# Patient Record
Sex: Male | Born: 1950 | Race: White | Hispanic: No | Marital: Married | Smoking: Never smoker
Health system: Southern US, Community
[De-identification: ages and names within clinical notes are randomized; demographics above are authoritative.]

## PROBLEM LIST (undated history)

## (undated) DIAGNOSIS — E119 Type 2 diabetes mellitus without complications: Secondary | ICD-10-CM

## (undated) DIAGNOSIS — I10 Essential (primary) hypertension: Secondary | ICD-10-CM

---

## 2018-11-10 ENCOUNTER — Other Ambulatory Visit: Payer: Self-pay

## 2018-11-10 ENCOUNTER — Encounter (INDEPENDENT_AMBULATORY_CARE_PROVIDER_SITE_OTHER): Payer: Medicare Other | Admitting: Ophthalmology

## 2018-11-10 DIAGNOSIS — H43813 Vitreous degeneration, bilateral: Secondary | ICD-10-CM

## 2018-11-10 DIAGNOSIS — H35033 Hypertensive retinopathy, bilateral: Secondary | ICD-10-CM | POA: Diagnosis not present

## 2018-11-10 DIAGNOSIS — H33022 Retinal detachment with multiple breaks, left eye: Secondary | ICD-10-CM | POA: Diagnosis not present

## 2018-11-10 DIAGNOSIS — H353132 Nonexudative age-related macular degeneration, bilateral, intermediate dry stage: Secondary | ICD-10-CM | POA: Diagnosis not present

## 2018-11-10 DIAGNOSIS — H2513 Age-related nuclear cataract, bilateral: Secondary | ICD-10-CM

## 2018-11-10 DIAGNOSIS — I10 Essential (primary) hypertension: Secondary | ICD-10-CM

## 2018-11-10 DIAGNOSIS — H4312 Vitreous hemorrhage, left eye: Secondary | ICD-10-CM

## 2019-01-11 ENCOUNTER — Encounter (INDEPENDENT_AMBULATORY_CARE_PROVIDER_SITE_OTHER): Payer: Medicare Other | Admitting: Ophthalmology

## 2019-01-13 ENCOUNTER — Encounter (INDEPENDENT_AMBULATORY_CARE_PROVIDER_SITE_OTHER): Payer: Medicare Other | Admitting: Ophthalmology

## 2019-01-13 ENCOUNTER — Other Ambulatory Visit: Payer: Self-pay

## 2019-01-13 DIAGNOSIS — E103592 Type 1 diabetes mellitus with proliferative diabetic retinopathy without macular edema, left eye: Secondary | ICD-10-CM | POA: Diagnosis not present

## 2019-01-13 DIAGNOSIS — E10319 Type 1 diabetes mellitus with unspecified diabetic retinopathy without macular edema: Secondary | ICD-10-CM

## 2019-01-13 DIAGNOSIS — H35033 Hypertensive retinopathy, bilateral: Secondary | ICD-10-CM

## 2019-01-13 DIAGNOSIS — I10 Essential (primary) hypertension: Secondary | ICD-10-CM

## 2019-01-13 DIAGNOSIS — E103291 Type 1 diabetes mellitus with mild nonproliferative diabetic retinopathy without macular edema, right eye: Secondary | ICD-10-CM | POA: Diagnosis not present

## 2019-01-13 DIAGNOSIS — H4312 Vitreous hemorrhage, left eye: Secondary | ICD-10-CM

## 2019-01-13 DIAGNOSIS — H43813 Vitreous degeneration, bilateral: Secondary | ICD-10-CM

## 2019-01-13 DIAGNOSIS — H33302 Unspecified retinal break, left eye: Secondary | ICD-10-CM

## 2019-01-13 DIAGNOSIS — H2513 Age-related nuclear cataract, bilateral: Secondary | ICD-10-CM

## 2019-01-13 DIAGNOSIS — H353111 Nonexudative age-related macular degeneration, right eye, early dry stage: Secondary | ICD-10-CM

## 2019-03-12 ENCOUNTER — Encounter (INDEPENDENT_AMBULATORY_CARE_PROVIDER_SITE_OTHER): Payer: Medicare Other | Admitting: Ophthalmology

## 2019-07-16 ENCOUNTER — Encounter (INDEPENDENT_AMBULATORY_CARE_PROVIDER_SITE_OTHER): Payer: Medicare Other | Admitting: Ophthalmology

## 2019-07-16 DIAGNOSIS — E103592 Type 1 diabetes mellitus with proliferative diabetic retinopathy without macular edema, left eye: Secondary | ICD-10-CM | POA: Diagnosis not present

## 2019-07-16 DIAGNOSIS — H43813 Vitreous degeneration, bilateral: Secondary | ICD-10-CM

## 2019-07-16 DIAGNOSIS — H35033 Hypertensive retinopathy, bilateral: Secondary | ICD-10-CM | POA: Diagnosis not present

## 2019-07-16 DIAGNOSIS — E10319 Type 1 diabetes mellitus with unspecified diabetic retinopathy without macular edema: Secondary | ICD-10-CM

## 2019-07-16 DIAGNOSIS — I10 Essential (primary) hypertension: Secondary | ICD-10-CM | POA: Diagnosis not present

## 2019-07-16 DIAGNOSIS — H353132 Nonexudative age-related macular degeneration, bilateral, intermediate dry stage: Secondary | ICD-10-CM

## 2020-01-14 ENCOUNTER — Other Ambulatory Visit: Payer: Self-pay

## 2020-01-14 ENCOUNTER — Encounter (INDEPENDENT_AMBULATORY_CARE_PROVIDER_SITE_OTHER): Payer: Medicare Other | Admitting: Ophthalmology

## 2020-01-14 DIAGNOSIS — I10 Essential (primary) hypertension: Secondary | ICD-10-CM

## 2020-01-14 DIAGNOSIS — E103592 Type 1 diabetes mellitus with proliferative diabetic retinopathy without macular edema, left eye: Secondary | ICD-10-CM | POA: Diagnosis not present

## 2020-01-14 DIAGNOSIS — H33302 Unspecified retinal break, left eye: Secondary | ICD-10-CM

## 2020-01-14 DIAGNOSIS — E103291 Type 1 diabetes mellitus with mild nonproliferative diabetic retinopathy without macular edema, right eye: Secondary | ICD-10-CM

## 2020-01-14 DIAGNOSIS — H35033 Hypertensive retinopathy, bilateral: Secondary | ICD-10-CM

## 2020-01-14 DIAGNOSIS — H43813 Vitreous degeneration, bilateral: Secondary | ICD-10-CM

## 2020-01-14 DIAGNOSIS — E10319 Type 1 diabetes mellitus with unspecified diabetic retinopathy without macular edema: Secondary | ICD-10-CM | POA: Diagnosis not present

## 2020-02-06 ENCOUNTER — Emergency Department (HOSPITAL_BASED_OUTPATIENT_CLINIC_OR_DEPARTMENT_OTHER): Payer: Medicare Other

## 2020-02-06 ENCOUNTER — Other Ambulatory Visit: Payer: Self-pay

## 2020-02-06 ENCOUNTER — Encounter (HOSPITAL_BASED_OUTPATIENT_CLINIC_OR_DEPARTMENT_OTHER): Payer: Self-pay

## 2020-02-06 ENCOUNTER — Emergency Department (HOSPITAL_BASED_OUTPATIENT_CLINIC_OR_DEPARTMENT_OTHER)
Admission: EM | Admit: 2020-02-06 | Discharge: 2020-02-06 | Disposition: A | Discharge status: Home / Self Care | Payer: Medicare Other | Attending: Emergency Medicine | Admitting: Emergency Medicine

## 2020-02-06 DIAGNOSIS — S0990XA Unspecified injury of head, initial encounter: Secondary | ICD-10-CM | POA: Diagnosis present

## 2020-02-06 DIAGNOSIS — Z79899 Other long term (current) drug therapy: Secondary | ICD-10-CM | POA: Insufficient documentation

## 2020-02-06 DIAGNOSIS — M25551 Pain in right hip: Secondary | ICD-10-CM | POA: Diagnosis not present

## 2020-02-06 DIAGNOSIS — S01411A Laceration without foreign body of right cheek and temporomandibular area, initial encounter: Secondary | ICD-10-CM | POA: Diagnosis not present

## 2020-02-06 DIAGNOSIS — Y998 Other external cause status: Secondary | ICD-10-CM | POA: Diagnosis not present

## 2020-02-06 DIAGNOSIS — E119 Type 2 diabetes mellitus without complications: Secondary | ICD-10-CM | POA: Insufficient documentation

## 2020-02-06 DIAGNOSIS — W19XXXA Unspecified fall, initial encounter: Secondary | ICD-10-CM | POA: Diagnosis not present

## 2020-02-06 DIAGNOSIS — I10 Essential (primary) hypertension: Secondary | ICD-10-CM | POA: Diagnosis not present

## 2020-02-06 DIAGNOSIS — Y92002 Bathroom of unspecified non-institutional (private) residence single-family (private) house as the place of occurrence of the external cause: Secondary | ICD-10-CM | POA: Diagnosis not present

## 2020-02-06 DIAGNOSIS — Y9352 Activity, horseback riding: Secondary | ICD-10-CM | POA: Diagnosis not present

## 2020-02-06 DIAGNOSIS — Z888 Allergy status to other drugs, medicaments and biological substances status: Secondary | ICD-10-CM | POA: Diagnosis not present

## 2020-02-06 DIAGNOSIS — S7291XA Unspecified fracture of right femur, initial encounter for closed fracture: Secondary | ICD-10-CM | POA: Insufficient documentation

## 2020-02-06 DIAGNOSIS — S0083XA Contusion of other part of head, initial encounter: Secondary | ICD-10-CM

## 2020-02-06 HISTORY — DX: Essential (primary) hypertension: I10

## 2020-02-06 HISTORY — DX: Type 2 diabetes mellitus without complications: E11.9

## 2020-02-06 NOTE — ED Notes (Signed)
[address] Elita Quick @ [phone] spoke to [facility]
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
## 2020-02-06 NOTE — ED Notes (Signed)
Patient transported to CT and XR 

## 2020-02-06 NOTE — ED Notes (Signed)
ED Provider at bedside. 

## 2020-02-06 NOTE — Discharge Instructions (Signed)
You were seen in the emergency department today after fall yesterday.  Your CT scans of the head and face were normal.  The laceration is too old to close with stitches.  Please keep the area clean and dry.  The Steri-Strips will come off on their own.  If you develop redness or drainage in the wound please follow closely with your PCP or return to the emergency department for antibiotics  I discussed the case with Dr. Turner Daniels.  We are giving you crutches and have advised that you cannot put more than 20 pounds of weight on the right leg.  You can use the crutches to help keep weight off.  He can see you in the office Tuesday morning at 8:30 AM.

## 2020-07-17 ENCOUNTER — Other Ambulatory Visit: Payer: Self-pay

## 2020-07-17 ENCOUNTER — Encounter (INDEPENDENT_AMBULATORY_CARE_PROVIDER_SITE_OTHER): Payer: Medicare Other | Admitting: Ophthalmology

## 2020-07-17 DIAGNOSIS — I10 Essential (primary) hypertension: Secondary | ICD-10-CM

## 2020-07-17 DIAGNOSIS — E11319 Type 2 diabetes mellitus with unspecified diabetic retinopathy without macular edema: Secondary | ICD-10-CM | POA: Diagnosis not present

## 2020-07-17 DIAGNOSIS — H43813 Vitreous degeneration, bilateral: Secondary | ICD-10-CM

## 2020-07-17 DIAGNOSIS — E113592 Type 2 diabetes mellitus with proliferative diabetic retinopathy without macular edema, left eye: Secondary | ICD-10-CM | POA: Diagnosis not present

## 2020-07-17 DIAGNOSIS — H33302 Unspecified retinal break, left eye: Secondary | ICD-10-CM

## 2020-07-17 DIAGNOSIS — E113291 Type 2 diabetes mellitus with mild nonproliferative diabetic retinopathy without macular edema, right eye: Secondary | ICD-10-CM | POA: Diagnosis not present

## 2020-07-17 DIAGNOSIS — H35033 Hypertensive retinopathy, bilateral: Secondary | ICD-10-CM

## 2020-07-17 DIAGNOSIS — H35372 Puckering of macula, left eye: Secondary | ICD-10-CM
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
## 2020-10-20 IMAGING — CR DG HIP (WITH OR WITHOUT PELVIS) 2-3V*R*
3 series · 3 of 3 positions shown · non-contrast
Comparison: None

CLINICAL DATA: Hip pain following fall

EXAM:
DG HIP (WITH OR WITHOUT PELVIS) 2-3V RIGHT

[t pelvis a.p.]
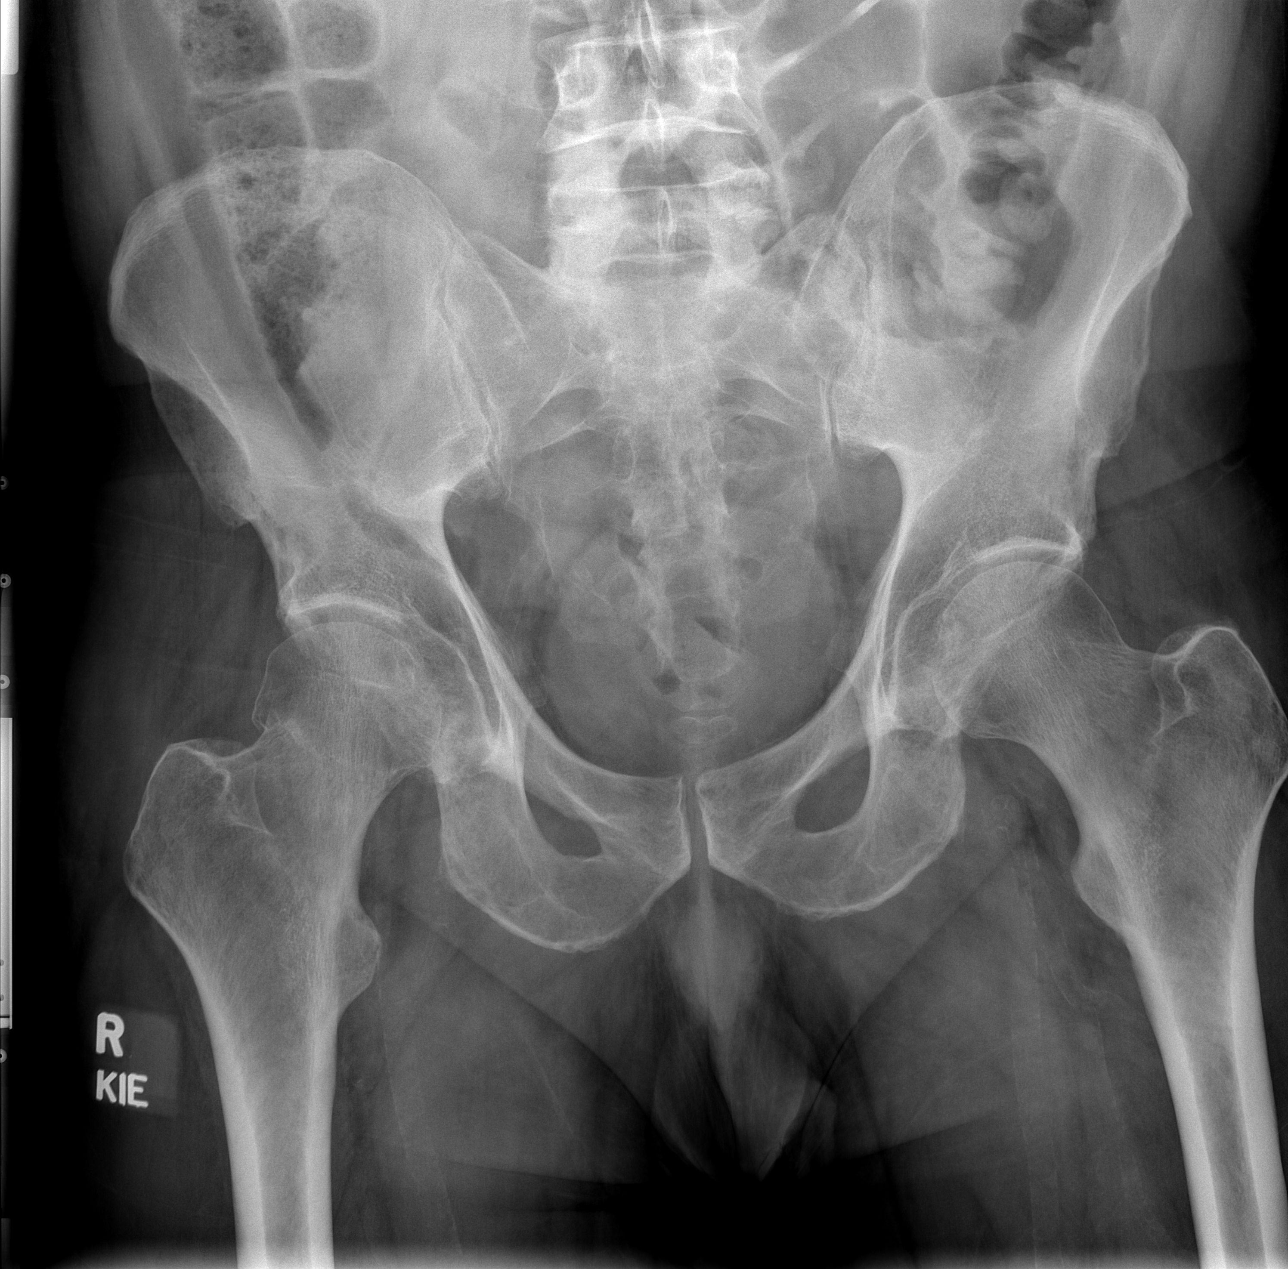

[t hip ap right]
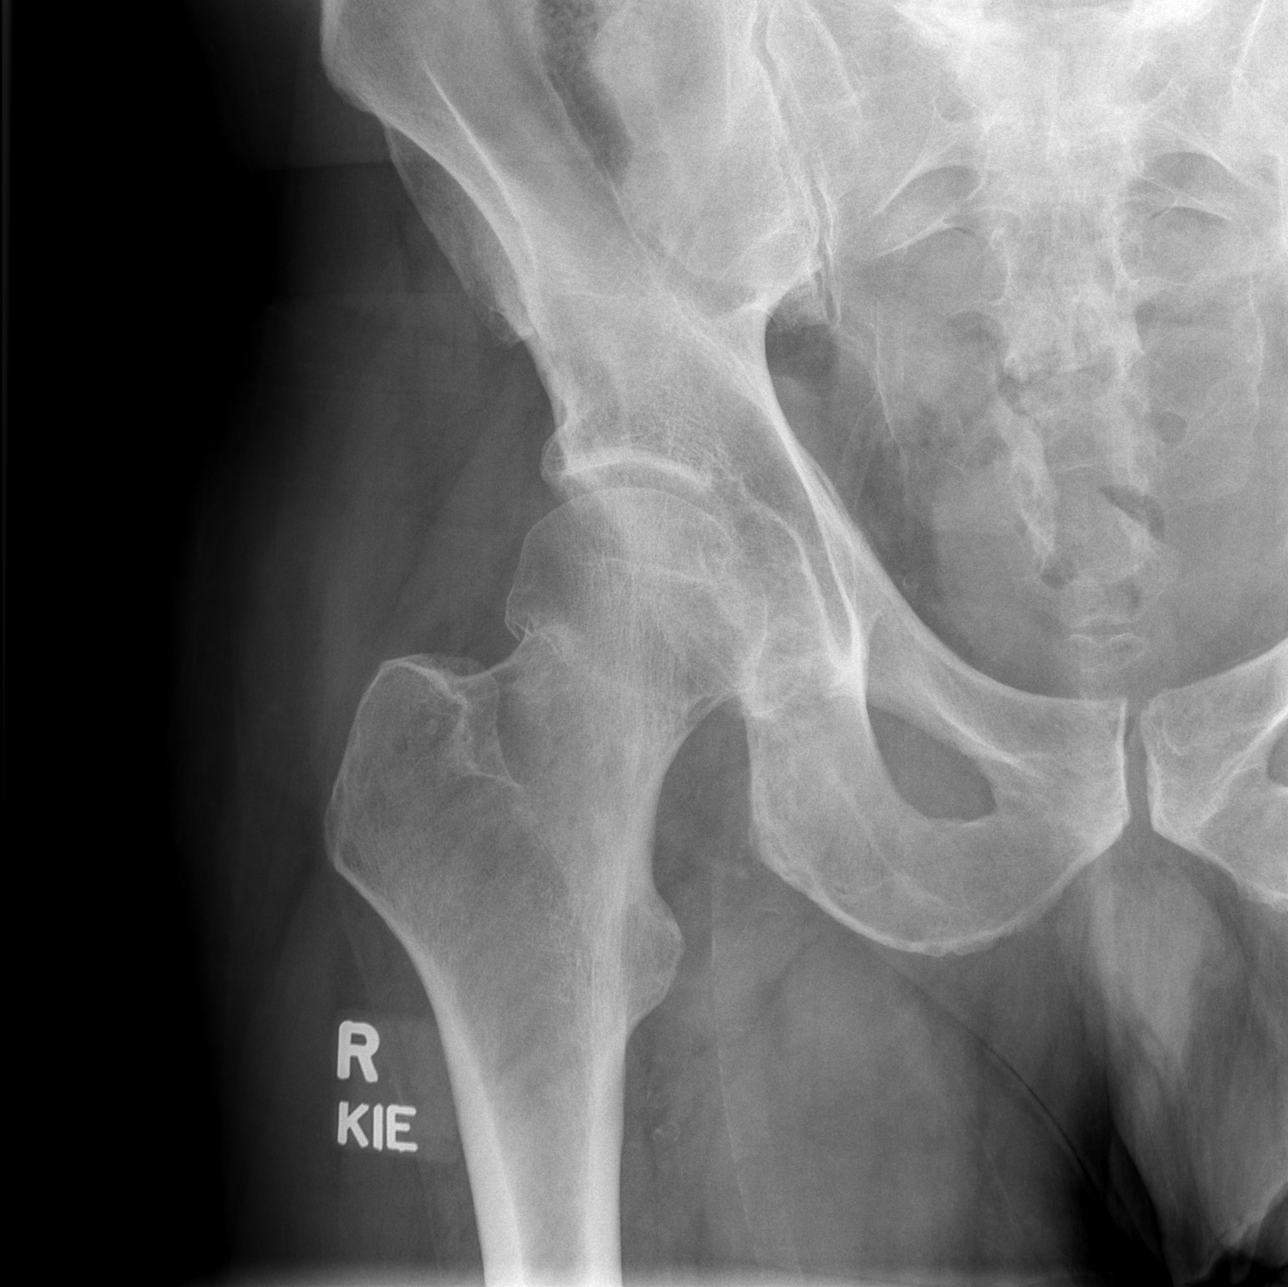

[t hip frog leg right]
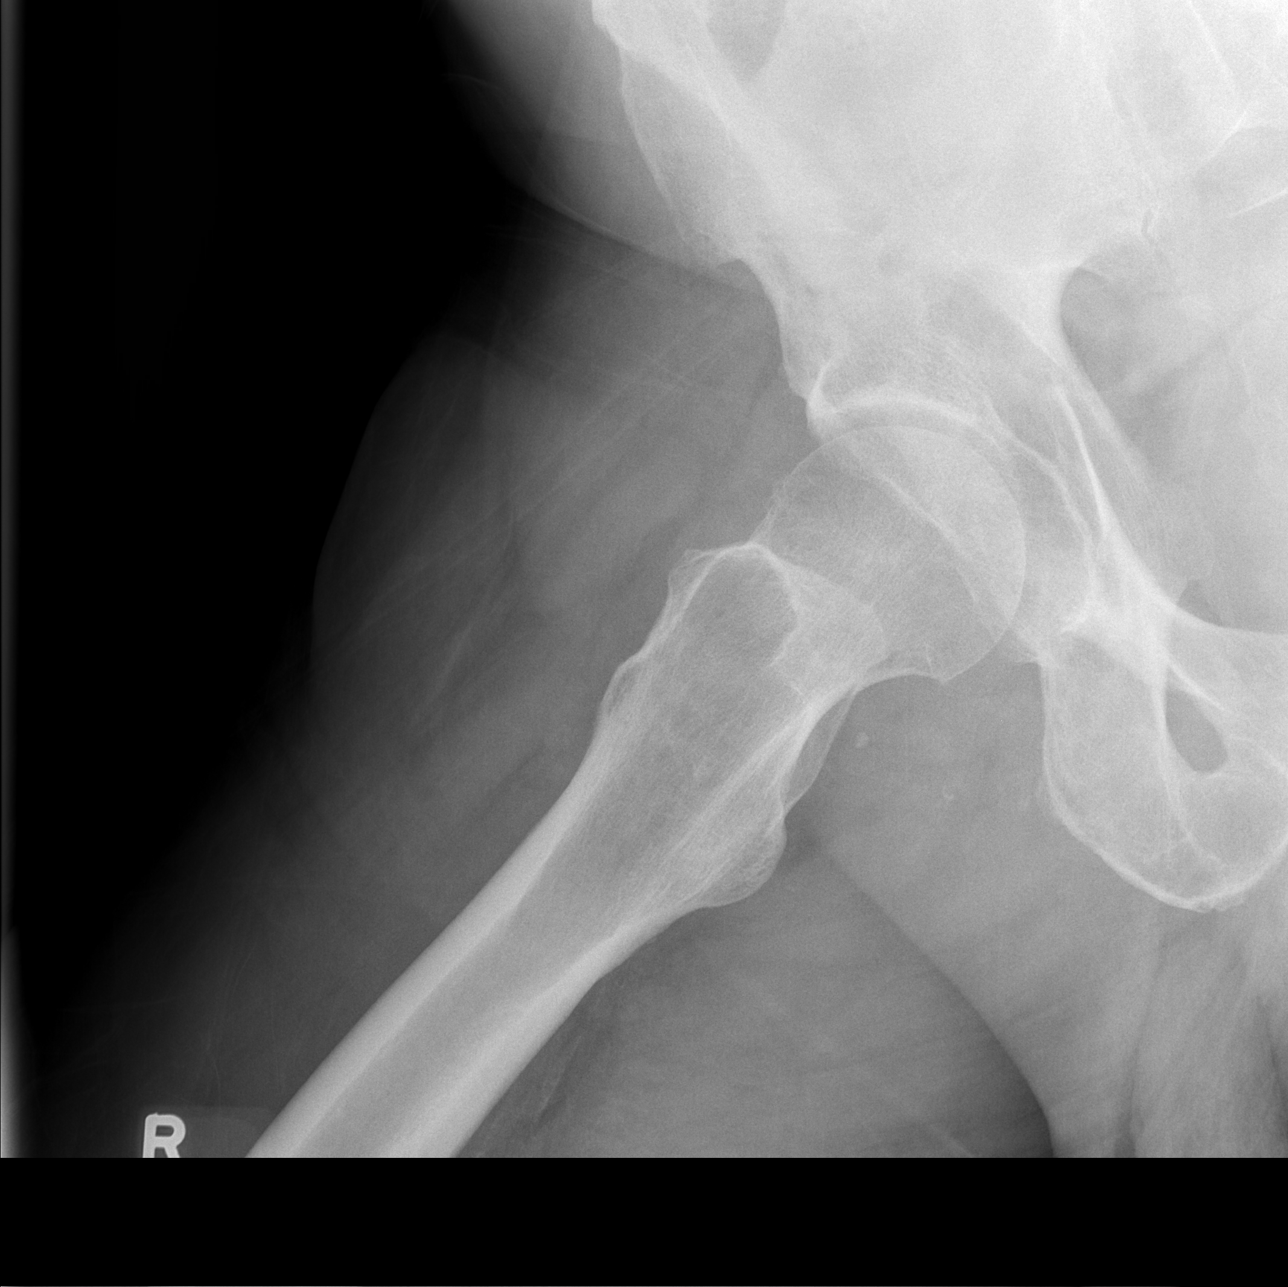

[3 of 3 positions shown; findings below may reference images not displayed]

FINDINGS: Bony pelvis without sign of fracture.

Hips are located.

On the RIGHT there is an impacted subcapital fracture of the RIGHT
proximal femur with mild medial displacement of the femoral neck
relative to femoral head. Best seen on the AP view.
IMPRESSION: Mildly displaced impacted subcapital RIGHT femoral fracture.

## 2021-01-15 ENCOUNTER — Encounter (INDEPENDENT_AMBULATORY_CARE_PROVIDER_SITE_OTHER): Payer: Medicare Other | Admitting: Ophthalmology

## 2021-01-23 ENCOUNTER — Encounter (INDEPENDENT_AMBULATORY_CARE_PROVIDER_SITE_OTHER): Payer: Medicare Other | Admitting: Ophthalmology

## 2021-01-23 ENCOUNTER — Other Ambulatory Visit: Payer: Self-pay

## 2021-01-23 DIAGNOSIS — E103291 Type 1 diabetes mellitus with mild nonproliferative diabetic retinopathy without macular edema, right eye: Secondary | ICD-10-CM | POA: Diagnosis not present

## 2021-01-23 DIAGNOSIS — H33302 Unspecified retinal break, left eye: Secondary | ICD-10-CM | POA: Diagnosis not present

## 2021-01-23 DIAGNOSIS — H43813 Vitreous degeneration, bilateral: Secondary | ICD-10-CM

## 2021-01-23 DIAGNOSIS — H353112 Nonexudative age-related macular degeneration, right eye, intermediate dry stage: Secondary | ICD-10-CM | POA: Diagnosis not present

## 2021-01-23 DIAGNOSIS — E103592 Type 1 diabetes mellitus with proliferative diabetic retinopathy without macular edema, left eye: Secondary | ICD-10-CM | POA: Diagnosis not present

## 2021-07-25 ENCOUNTER — Encounter (INDEPENDENT_AMBULATORY_CARE_PROVIDER_SITE_OTHER): Payer: Medicare Other | Admitting: Ophthalmology

## 2021-07-25 ENCOUNTER — Other Ambulatory Visit: Payer: Self-pay

## 2021-07-25 DIAGNOSIS — I10 Essential (primary) hypertension: Secondary | ICD-10-CM

## 2021-07-25 DIAGNOSIS — H43813 Vitreous degeneration, bilateral: Secondary | ICD-10-CM

## 2021-07-25 DIAGNOSIS — H353132 Nonexudative age-related macular degeneration, bilateral, intermediate dry stage: Secondary | ICD-10-CM | POA: Diagnosis not present

## 2021-07-25 DIAGNOSIS — E113391 Type 2 diabetes mellitus with moderate nonproliferative diabetic retinopathy without macular edema, right eye: Secondary | ICD-10-CM

## 2021-07-25 DIAGNOSIS — H35033 Hypertensive retinopathy, bilateral: Secondary | ICD-10-CM

## 2021-07-25 DIAGNOSIS — E113592 Type 2 diabetes mellitus with proliferative diabetic retinopathy without macular edema, left eye: Secondary | ICD-10-CM

## 2021-07-25 DIAGNOSIS — H33302 Unspecified retinal break, left eye: Secondary | ICD-10-CM

## 2022-01-23 ENCOUNTER — Encounter (INDEPENDENT_AMBULATORY_CARE_PROVIDER_SITE_OTHER): Payer: Medicare Other | Admitting: Ophthalmology

## 2022-01-23 DIAGNOSIS — E103391 Type 1 diabetes mellitus with moderate nonproliferative diabetic retinopathy without macular edema, right eye: Secondary | ICD-10-CM | POA: Diagnosis not present

## 2022-01-23 DIAGNOSIS — H353112 Nonexudative age-related macular degeneration, right eye, intermediate dry stage: Secondary | ICD-10-CM

## 2022-01-23 DIAGNOSIS — H35033 Hypertensive retinopathy, bilateral: Secondary | ICD-10-CM | POA: Diagnosis not present

## 2022-01-23 DIAGNOSIS — I10 Essential (primary) hypertension: Secondary | ICD-10-CM | POA: Diagnosis not present

## 2022-01-23 DIAGNOSIS — H43813 Vitreous degeneration, bilateral: Secondary | ICD-10-CM | POA: Diagnosis not present

## 2022-01-23 DIAGNOSIS — E103592 Type 1 diabetes mellitus with proliferative diabetic retinopathy without macular edema, left eye: Secondary | ICD-10-CM

## 2022-01-23 DIAGNOSIS — H33302 Unspecified retinal break, left eye: Secondary | ICD-10-CM | POA: Diagnosis not present

## 2022-07-24 ENCOUNTER — Encounter (INDEPENDENT_AMBULATORY_CARE_PROVIDER_SITE_OTHER): Payer: Medicare Other | Admitting: Ophthalmology

## 2022-07-24 DIAGNOSIS — I10 Essential (primary) hypertension: Secondary | ICD-10-CM | POA: Diagnosis not present

## 2022-07-24 DIAGNOSIS — E103391 Type 1 diabetes mellitus with moderate nonproliferative diabetic retinopathy without macular edema, right eye: Secondary | ICD-10-CM | POA: Diagnosis not present

## 2022-07-24 DIAGNOSIS — H353132 Nonexudative age-related macular degeneration, bilateral, intermediate dry stage: Secondary | ICD-10-CM

## 2022-07-24 DIAGNOSIS — E103592 Type 1 diabetes mellitus with proliferative diabetic retinopathy without macular edema, left eye: Secondary | ICD-10-CM

## 2022-07-24 DIAGNOSIS — H35033 Hypertensive retinopathy, bilateral: Secondary | ICD-10-CM

## 2022-07-24 DIAGNOSIS — H43813 Vitreous degeneration, bilateral: Secondary | ICD-10-CM

## 2022-07-30 ENCOUNTER — Encounter (INDEPENDENT_AMBULATORY_CARE_PROVIDER_SITE_OTHER): Payer: Medicare Other | Admitting: Ophthalmology

## 2022-07-30 DIAGNOSIS — H5340 Unspecified visual field defects: Secondary | ICD-10-CM

## 2022-10-07 ENCOUNTER — Other Ambulatory Visit (HOSPITAL_BASED_OUTPATIENT_CLINIC_OR_DEPARTMENT_OTHER): Payer: Self-pay | Admitting: Internal Medicine

## 2022-10-07 DIAGNOSIS — H538 Other visual disturbances: Secondary | ICD-10-CM

## 2022-11-26 ENCOUNTER — Ambulatory Visit (HOSPITAL_COMMUNITY)
Admission: RE | Admit: 2022-11-26 | Discharge: 2022-11-26 | Disposition: A | Discharge status: Home / Self Care | Payer: Medicare Other | Source: Ambulatory Visit | Attending: Internal Medicine | Admitting: Internal Medicine

## 2022-11-26 DIAGNOSIS — H538 Other visual disturbances: Secondary | ICD-10-CM | POA: Insufficient documentation

## 2022-11-26 DIAGNOSIS — H5340 Unspecified visual field defects: Secondary | ICD-10-CM | POA: Insufficient documentation

## 2022-11-26 DIAGNOSIS — H5702 Anisocoria: Secondary | ICD-10-CM | POA: Insufficient documentation

## 2022-11-26 MED ORDER — GADOBUTROL 1 MMOL/ML IV SOLN
10.0000 mL | Freq: Once | INTRAVENOUS | Status: AC | PRN
  Administered 2022-11-26: 10 mL via INTRAVENOUS

## 2023-01-22 ENCOUNTER — Encounter (INDEPENDENT_AMBULATORY_CARE_PROVIDER_SITE_OTHER): Payer: Medicare Other | Admitting: Ophthalmology

## 2023-01-22 DIAGNOSIS — E113391 Type 2 diabetes mellitus with moderate nonproliferative diabetic retinopathy without macular edema, right eye: Secondary | ICD-10-CM

## 2023-01-22 DIAGNOSIS — E113592 Type 2 diabetes mellitus with proliferative diabetic retinopathy without macular edema, left eye: Secondary | ICD-10-CM | POA: Diagnosis not present

## 2023-01-22 DIAGNOSIS — H353132 Nonexudative age-related macular degeneration, bilateral, intermediate dry stage: Secondary | ICD-10-CM

## 2023-01-22 DIAGNOSIS — I10 Essential (primary) hypertension: Secondary | ICD-10-CM

## 2023-01-22 DIAGNOSIS — H35033 Hypertensive retinopathy, bilateral: Secondary | ICD-10-CM

## 2023-01-22 DIAGNOSIS — H33302 Unspecified retinal break, left eye: Secondary | ICD-10-CM

## 2023-01-22 DIAGNOSIS — H43813 Vitreous degeneration, bilateral: Secondary | ICD-10-CM

## 2023-01-22 DIAGNOSIS — Z794 Long term (current) use of insulin: Secondary | ICD-10-CM

## 2023-07-23 ENCOUNTER — Encounter (INDEPENDENT_AMBULATORY_CARE_PROVIDER_SITE_OTHER): Payer: Medicare Other | Admitting: Ophthalmology
